# Patient Record
Sex: Female | Born: 1975 | Race: White | Hispanic: No | Marital: Married | Smoking: Never smoker
Health system: Southern US, Community
[De-identification: ages and names within clinical notes are randomized; demographics above are authoritative.]

## PROBLEM LIST (undated history)

## (undated) DIAGNOSIS — T8859XA Other complications of anesthesia, initial encounter: Secondary | ICD-10-CM

## (undated) DIAGNOSIS — Z789 Other specified health status: Secondary | ICD-10-CM

## (undated) DIAGNOSIS — F419 Anxiety disorder, unspecified: Secondary | ICD-10-CM

## (undated) DIAGNOSIS — T4145XA Adverse effect of unspecified anesthetic, initial encounter: Secondary | ICD-10-CM

## (undated) HISTORY — DX: Anxiety disorder, unspecified: F41.9

## (undated) HISTORY — PX: WISDOM TOOTH EXTRACTION: SHX21

---

## 1998-11-20 ENCOUNTER — Emergency Department (HOSPITAL_COMMUNITY): Admission: EM | Admit: 1998-11-20 | Discharge: 1998-11-20 | Payer: Self-pay | Admitting: Emergency Medicine

## 2000-12-18 ENCOUNTER — Other Ambulatory Visit: Admission: RE | Admit: 2000-12-18 | Discharge: 2000-12-18 | Payer: Self-pay | Admitting: *Deleted

## 2002-01-07 ENCOUNTER — Encounter: Payer: Self-pay | Admitting: Gastroenterology

## 2002-01-07 ENCOUNTER — Ambulatory Visit (HOSPITAL_COMMUNITY): Admission: RE | Admit: 2002-01-07 | Discharge: 2002-01-07 | Payer: Self-pay | Admitting: Gastroenterology

## 2002-01-21 ENCOUNTER — Other Ambulatory Visit: Admission: RE | Admit: 2002-01-21 | Discharge: 2002-01-21 | Payer: Self-pay | Admitting: Obstetrics & Gynecology

## 2002-05-01 ENCOUNTER — Other Ambulatory Visit: Admission: RE | Admit: 2002-05-01 | Discharge: 2002-05-01 | Payer: Self-pay | Admitting: Obstetrics & Gynecology

## 2002-12-09 ENCOUNTER — Other Ambulatory Visit: Admission: RE | Admit: 2002-12-09 | Discharge: 2002-12-09 | Payer: Self-pay | Admitting: Obstetrics & Gynecology

## 2003-05-25 ENCOUNTER — Other Ambulatory Visit: Admission: RE | Admit: 2003-05-25 | Discharge: 2003-05-25 | Payer: Self-pay | Admitting: Obstetrics & Gynecology

## 2006-01-22 ENCOUNTER — Inpatient Hospital Stay (HOSPITAL_COMMUNITY): Admission: AD | Admit: 2006-01-22 | Discharge: 2006-01-24 | Payer: Self-pay | Admitting: Obstetrics & Gynecology

## 2009-01-05 ENCOUNTER — Ambulatory Visit (HOSPITAL_COMMUNITY): Admission: AD | Admit: 2009-01-05 | Discharge: 2009-01-05 | Payer: Self-pay | Admitting: Obstetrics & Gynecology

## 2009-01-19 ENCOUNTER — Ambulatory Visit (HOSPITAL_COMMUNITY): Admission: RE | Admit: 2009-01-19 | Discharge: 2009-01-19 | Payer: Self-pay | Admitting: Obstetrics & Gynecology

## 2009-01-25 ENCOUNTER — Ambulatory Visit (HOSPITAL_COMMUNITY): Admission: RE | Admit: 2009-01-25 | Discharge: 2009-01-25 | Payer: Self-pay | Admitting: Obstetrics & Gynecology

## 2009-03-01 ENCOUNTER — Ambulatory Visit (HOSPITAL_COMMUNITY): Admission: RE | Admit: 2009-03-01 | Discharge: 2009-03-01 | Payer: Self-pay | Admitting: Obstetrics & Gynecology

## 2009-04-27 ENCOUNTER — Ambulatory Visit (HOSPITAL_COMMUNITY): Admission: RE | Admit: 2009-04-27 | Discharge: 2009-04-27 | Payer: Self-pay | Admitting: Obstetrics & Gynecology

## 2009-05-24 ENCOUNTER — Ambulatory Visit (HOSPITAL_COMMUNITY): Admission: RE | Admit: 2009-05-24 | Discharge: 2009-05-24 | Payer: Self-pay | Admitting: Obstetrics & Gynecology

## 2009-06-17 ENCOUNTER — Ambulatory Visit (HOSPITAL_COMMUNITY): Admission: RE | Admit: 2009-06-17 | Discharge: 2009-06-17 | Payer: Self-pay | Admitting: Obstetrics & Gynecology

## 2009-07-08 ENCOUNTER — Ambulatory Visit (HOSPITAL_COMMUNITY): Admission: RE | Admit: 2009-07-08 | Discharge: 2009-07-08 | Payer: Self-pay | Admitting: Obstetrics & Gynecology

## 2009-07-16 ENCOUNTER — Inpatient Hospital Stay (HOSPITAL_COMMUNITY): Admission: AD | Admit: 2009-07-16 | Discharge: 2009-07-20 | Payer: Self-pay | Admitting: Obstetrics & Gynecology

## 2009-07-17 ENCOUNTER — Encounter (INDEPENDENT_AMBULATORY_CARE_PROVIDER_SITE_OTHER): Payer: Self-pay | Admitting: Obstetrics & Gynecology

## 2010-04-16 IMAGING — US US OB FOLLOW-UP
1 series · 14 of 22 positions shown · non-contrast
Comparison: none

OBSTETRICAL ULTRASOUND:
 This ultrasound was performed in The [HOSPITAL], and the [facility] report will be stored to [REDACTED] PACS.  This report is also available in [HOSPITAL]?s accessANYware.

[Series 1: us ob follow-up · 14 of 22 slices shown]
[im 1/22]
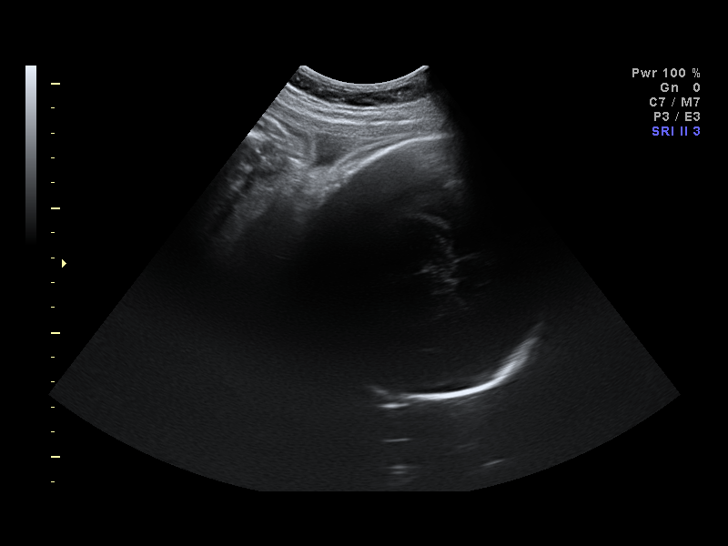
[im 3/22]
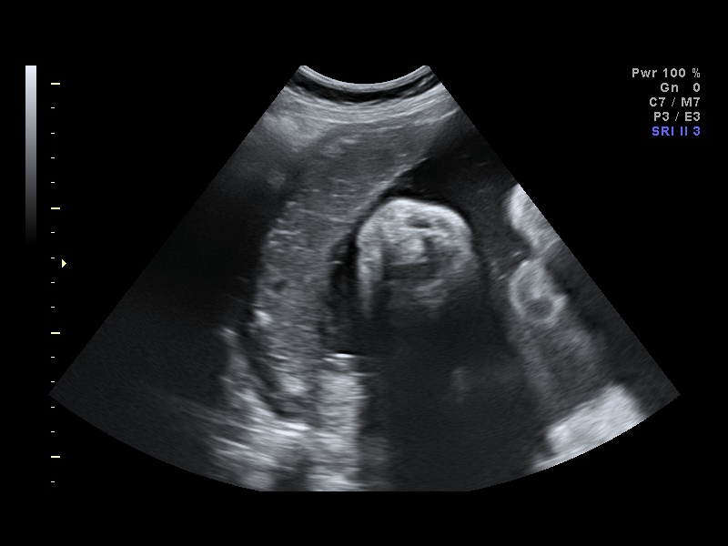
[im 4/22]
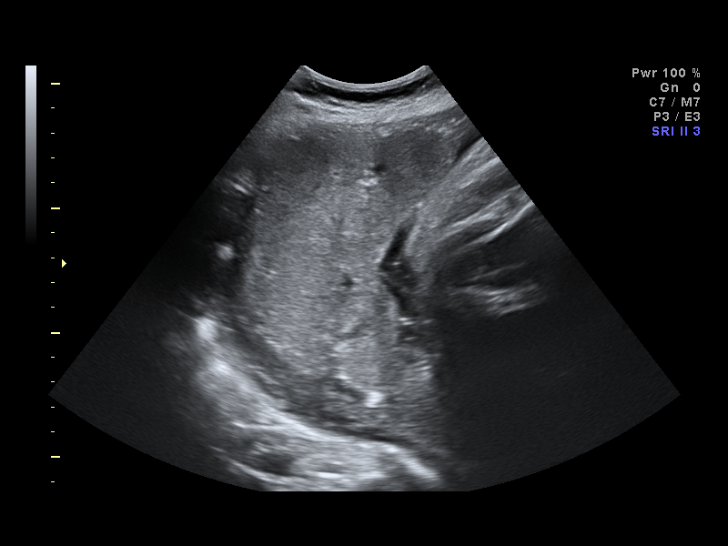
[im 6/22]
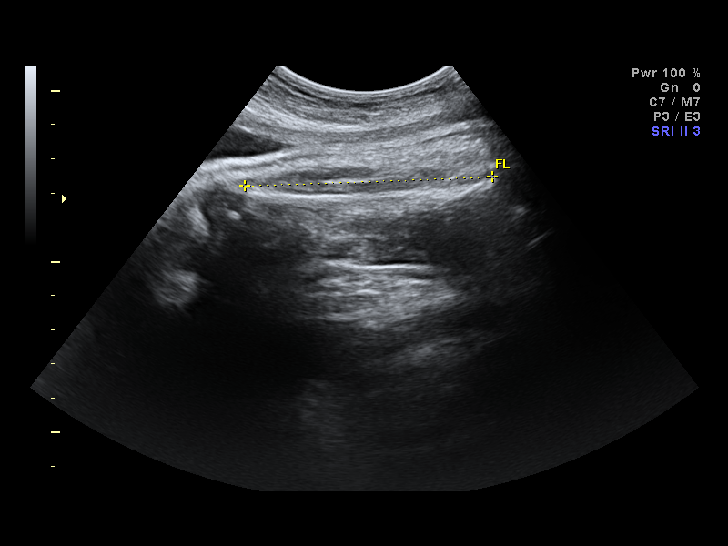
[im 8/22]
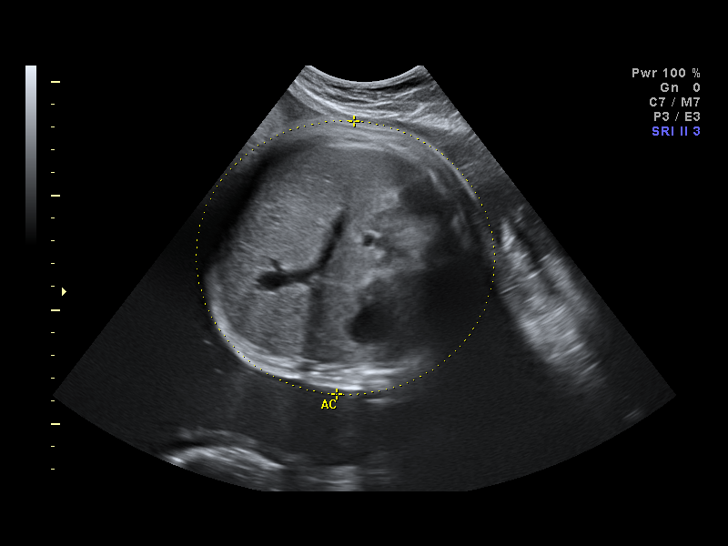
[im 9/22]
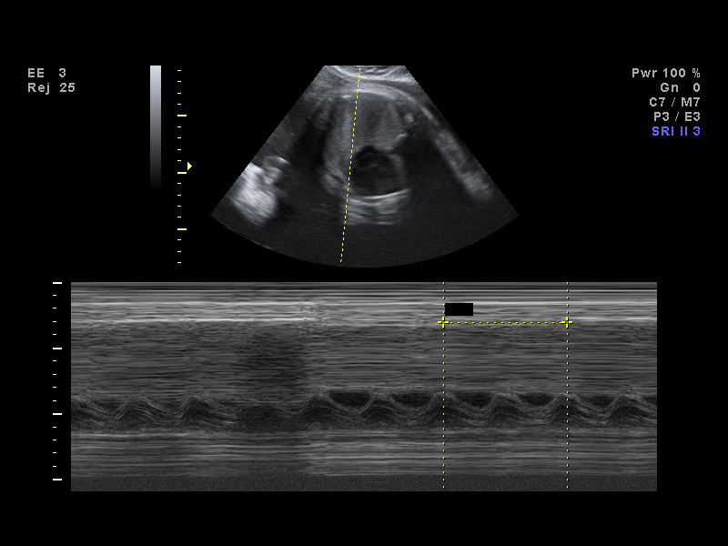
[im 11/22]
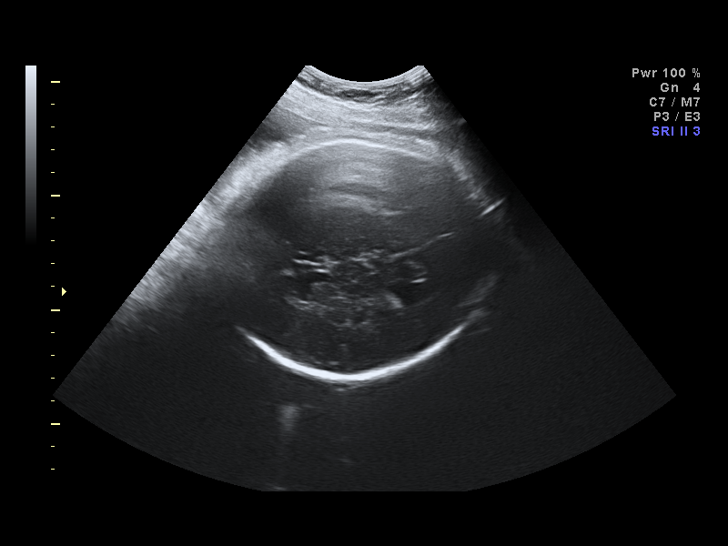
[im 12/22]
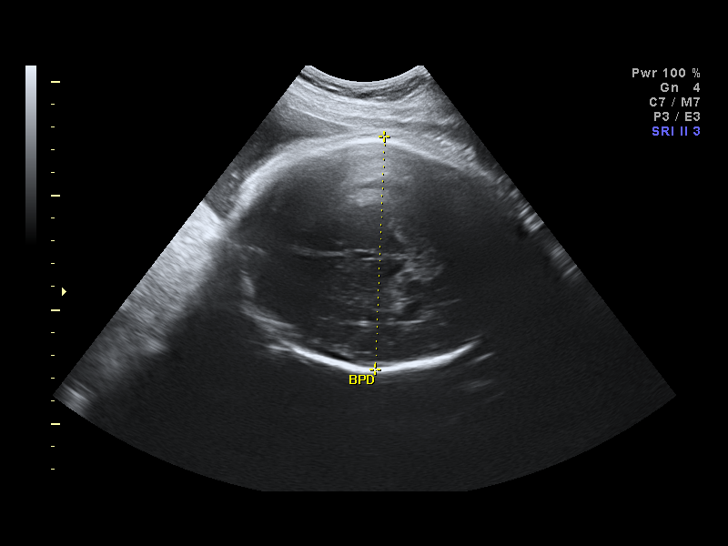
[im 14/22]
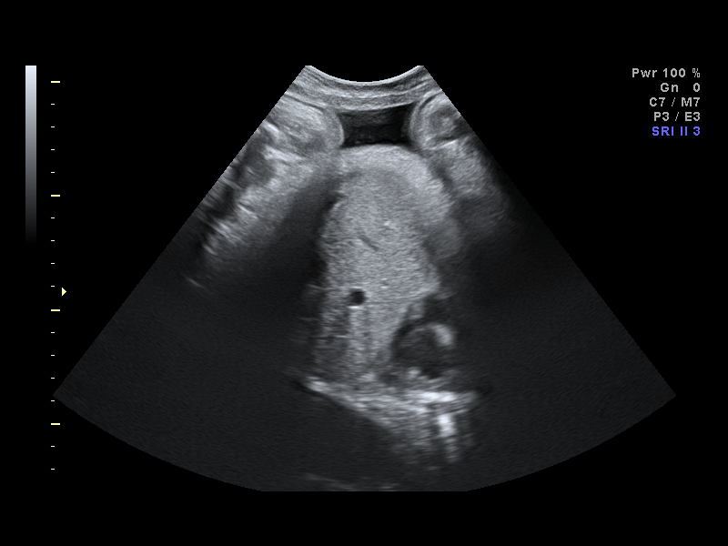
[im 15/22]
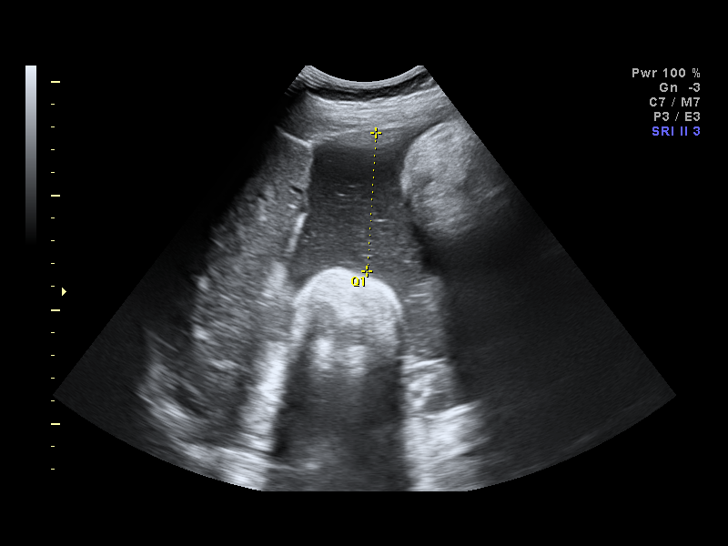
[im 17/22]
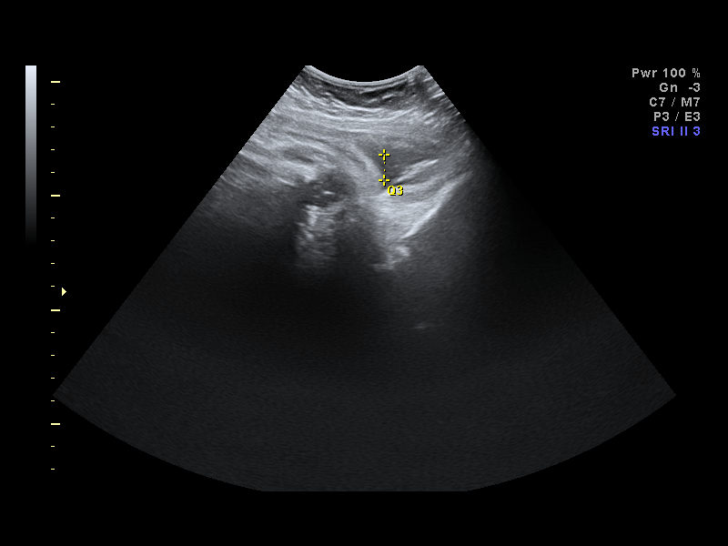
[im 19/22]
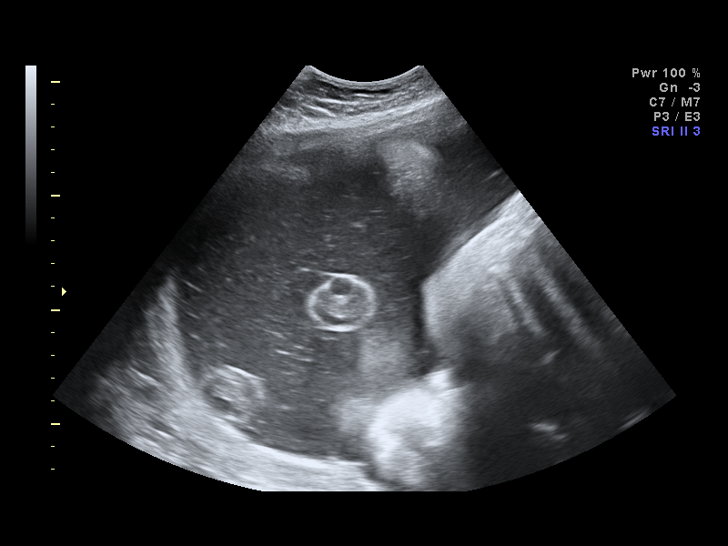
[im 20/22]
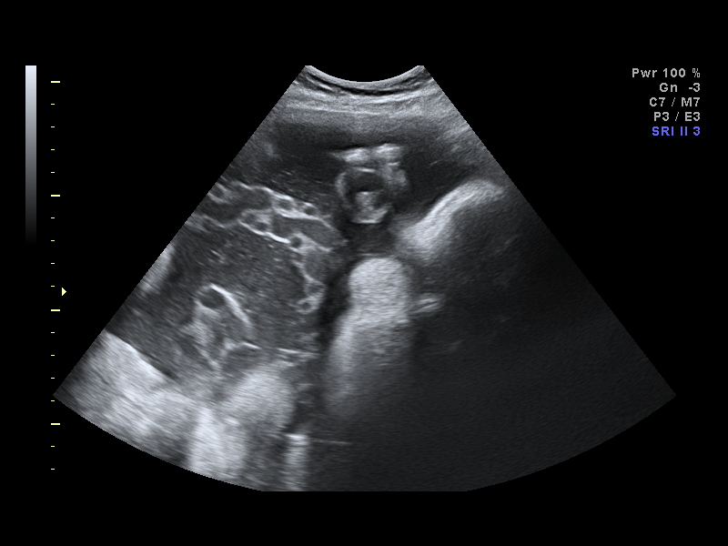
[im 22/22]
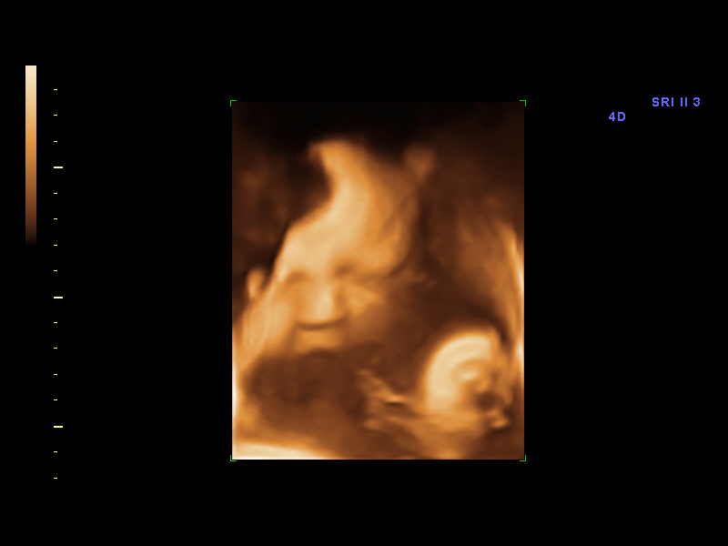

[14 of 22 positions shown; findings below may reference images not displayed]

IMPRESSION: [facility] has also been faxed to the ordering physician.

## 2010-07-23 LAB — CBC
Hemoglobin: 12.1 g/dL (ref 12.0–15.0)
MCHC: 33.5 g/dL (ref 30.0–36.0)
MCV: 94.8 fL (ref 78.0–100.0)
Platelets: 242 10*3/uL (ref 150–400)
RDW: 13.6 % (ref 11.5–15.5)
WBC: 17 10*3/uL — ABNORMAL HIGH (ref 4.0–10.5)

## 2010-07-23 LAB — RPR: RPR Ser Ql: NONREACTIVE

## 2012-01-31 ENCOUNTER — Encounter: Payer: Self-pay | Admitting: Cardiovascular Disease

## 2012-03-05 ENCOUNTER — Encounter: Payer: Self-pay | Admitting: Internal Medicine

## 2012-08-25 ENCOUNTER — Encounter: Payer: Self-pay | Admitting: Internal Medicine

## 2012-11-12 LAB — OB RESULTS CONSOLE HEPATITIS B SURFACE ANTIGEN: HEP B S AG: NEGATIVE

## 2012-11-12 LAB — OB RESULTS CONSOLE RPR: RPR: NONREACTIVE

## 2012-11-12 LAB — OB RESULTS CONSOLE HIV ANTIBODY (ROUTINE TESTING): HIV: NONREACTIVE

## 2012-11-12 LAB — OB RESULTS CONSOLE ABO/RH: RH TYPE: POSITIVE

## 2012-11-12 LAB — OB RESULTS CONSOLE ANTIBODY SCREEN: Antibody Screen: NEGATIVE

## 2012-11-12 LAB — OB RESULTS CONSOLE RUBELLA ANTIBODY, IGM: Rubella: IMMUNE

## 2013-06-03 LAB — OB RESULTS CONSOLE GBS: GBS: NEGATIVE

## 2013-06-25 ENCOUNTER — Inpatient Hospital Stay (HOSPITAL_COMMUNITY): Payer: BC Managed Care – PPO | Admitting: Anesthesiology

## 2013-06-25 ENCOUNTER — Inpatient Hospital Stay (HOSPITAL_COMMUNITY)
Admission: AD | Admit: 2013-06-25 | Discharge: 2013-06-27 | DRG: 765 | Disposition: A | Discharge status: Home / Self Care | Payer: BC Managed Care – PPO | Source: Ambulatory Visit | Attending: Obstetrics & Gynecology | Admitting: Obstetrics & Gynecology

## 2013-06-25 ENCOUNTER — Encounter (HOSPITAL_COMMUNITY)
Admission: AD | Disposition: A | Discharge status: Home / Self Care | Payer: Self-pay | Source: Ambulatory Visit | Attending: Obstetrics & Gynecology

## 2013-06-25 ENCOUNTER — Encounter (HOSPITAL_COMMUNITY): Payer: BC Managed Care – PPO | Admitting: Anesthesiology

## 2013-06-25 ENCOUNTER — Encounter (HOSPITAL_COMMUNITY): Payer: Self-pay | Admitting: *Deleted

## 2013-06-25 DIAGNOSIS — O34219 Maternal care for unspecified type scar from previous cesarean delivery: Secondary | ICD-10-CM | POA: Diagnosis present

## 2013-06-25 DIAGNOSIS — O9903 Anemia complicating the puerperium: Secondary | ICD-10-CM | POA: Diagnosis not present

## 2013-06-25 DIAGNOSIS — O09529 Supervision of elderly multigravida, unspecified trimester: Secondary | ICD-10-CM | POA: Diagnosis present

## 2013-06-25 DIAGNOSIS — O3660X0 Maternal care for excessive fetal growth, unspecified trimester, not applicable or unspecified: Principal | ICD-10-CM | POA: Diagnosis present

## 2013-06-25 DIAGNOSIS — D62 Acute posthemorrhagic anemia: Secondary | ICD-10-CM | POA: Diagnosis not present

## 2013-06-25 HISTORY — DX: Other specified health status: Z78.9

## 2013-06-25 HISTORY — DX: Adverse effect of unspecified anesthetic, initial encounter: T41.45XA

## 2013-06-25 HISTORY — DX: Other complications of anesthesia, initial encounter: T88.59XA

## 2013-06-25 LAB — CBC
HCT: 34.7 % — ABNORMAL LOW (ref 36.0–46.0)
HCT: 34.5 % — ABNORMAL LOW (ref 36.0–46.0)
HEMOGLOBIN: 11.8 g/dL — AB (ref 12.0–15.0)
Hemoglobin: 11.8 g/dL — ABNORMAL LOW (ref 12.0–15.0)
MCH: 30.6 pg (ref 26.0–34.0)
MCH: 31.1 pg (ref 26.0–34.0)
MCHC: 34 g/dL (ref 30.0–36.0)
MCHC: 34.2 g/dL (ref 30.0–36.0)
MCV: 89.9 fL (ref 78.0–100.0)
MCV: 90.8 fL (ref 78.0–100.0)
PLATELETS: 193 10*3/uL (ref 150–400)
Platelets: 167 10*3/uL (ref 150–400)
RBC: 3.86 MIL/uL — AB (ref 3.87–5.11)
RBC: 3.8 MIL/uL — ABNORMAL LOW (ref 3.87–5.11)
RDW: 13.8 % (ref 11.5–15.5)
RDW: 13.8 % (ref 11.5–15.5)
WBC: 12.4 10*3/uL — ABNORMAL HIGH (ref 4.0–10.5)
WBC: 12.7 10*3/uL — AB (ref 4.0–10.5)

## 2013-06-25 LAB — TYPE AND SCREEN
ABO/RH(D): A POS
Antibody Screen: NEGATIVE

## 2013-06-25 LAB — ABO/RH: ABO/RH(D): A POS

## 2013-06-25 LAB — RPR: RPR: NONREACTIVE

## 2013-06-25 SURGERY — Surgical Case
Anesthesia: Spinal | Site: Abdomen

## 2013-06-25 MED ORDER — PHENYLEPHRINE 8 MG IN D5W 100 ML (0.08MG/ML) PREMIX OPTIME
INJECTION | INTRAVENOUS | Status: DC | PRN
Start: 2013-06-25 — End: 2013-06-25
  Administered 2013-06-25: 45 ug/min via INTRAVENOUS

## 2013-06-25 MED ORDER — CEFAZOLIN SODIUM-DEXTROSE 2-3 GM-% IV SOLR: 2.0000 g | INTRAVENOUS | Status: DC

## 2013-06-25 MED ORDER — CEFAZOLIN SODIUM-DEXTROSE 2-3 GM-% IV SOLR
2.0000 g | INTRAVENOUS | Status: AC
  Administered 2013-06-25: 2 g via INTRAVENOUS

## 2013-06-25 MED ORDER — OXYCODONE-ACETAMINOPHEN 5-325 MG PO TABS
1.0000 | ORAL_TABLET | ORAL | Status: DC | PRN
  Administered 2013-06-26 (×2): 2 via ORAL
  Administered 2013-06-27: 1 via ORAL
  Filled 2013-06-25: qty 1
  Filled 2013-06-25 (×2): qty 2

## 2013-06-25 MED ORDER — OXYTOCIN 40 UNITS IN LACTATED RINGERS INFUSION - SIMPLE MED: 62.5000 mL/h | INTRAVENOUS | Status: AC

## 2013-06-25 MED ORDER — ATROPINE SULFATE 0.4 MG/ML IJ SOLN
INTRAMUSCULAR | Status: DC | PRN
  Administered 2013-06-25: .1 mg via INTRAVENOUS
  Administered 2013-06-25: 0.2 mg via INTRAVENOUS

## 2013-06-25 MED ORDER — WITCH HAZEL-GLYCERIN EX PADS: 1.0000 "application " | MEDICATED_PAD | CUTANEOUS | Status: DC | PRN

## 2013-06-25 MED ORDER — EPHEDRINE SULFATE 50 MG/ML IJ SOLN
INTRAMUSCULAR | Status: DC | PRN
  Administered 2013-06-25: 10 mg via INTRAVENOUS
  Administered 2013-06-25: 15 mg via INTRAVENOUS

## 2013-06-25 MED ORDER — KETOROLAC TROMETHAMINE 30 MG/ML IJ SOLN
INTRAMUSCULAR | Status: AC
  Administered 2013-06-25: 30 mg via INTRAMUSCULAR
  Filled 2013-06-25: qty 1

## 2013-06-25 MED ORDER — LORATADINE 10 MG PO TABS
10.0000 mg | ORAL_TABLET | Freq: Every day | ORAL | Status: DC
  Administered 2013-06-26 – 2013-06-27 (×2): 10 mg via ORAL
  Filled 2013-06-25 (×4): qty 1

## 2013-06-25 MED ORDER — METHYLERGONOVINE MALEATE 0.2 MG/ML IJ SOLN
INTRAMUSCULAR | Status: AC
  Filled 2013-06-25: qty 1

## 2013-06-25 MED ORDER — LANOLIN HYDROUS EX OINT: 1.0000 "application " | TOPICAL_OINTMENT | CUTANEOUS | Status: DC | PRN

## 2013-06-25 MED ORDER — OXYTOCIN 10 UNIT/ML IJ SOLN
INTRAMUSCULAR | Status: AC
  Filled 2013-06-25: qty 1

## 2013-06-25 MED ORDER — METHYLERGONOVINE MALEATE 0.2 MG/ML IJ SOLN
INTRAMUSCULAR | Status: DC | PRN
  Administered 2013-06-25: 0.2 mg via INTRAMUSCULAR

## 2013-06-25 MED ORDER — CEFAZOLIN SODIUM-DEXTROSE 2-3 GM-% IV SOLR
INTRAVENOUS | Status: AC
  Filled 2013-06-25: qty 50

## 2013-06-25 MED ORDER — FENTANYL CITRATE 0.05 MG/ML IJ SOLN
INTRAMUSCULAR | Status: DC | PRN
  Administered 2013-06-25: 25 ug via INTRATHECAL

## 2013-06-25 MED ORDER — PHENYLEPHRINE 8 MG IN D5W 100 ML (0.08MG/ML) PREMIX OPTIME
INJECTION | INTRAVENOUS | Status: AC
  Filled 2013-06-25: qty 100

## 2013-06-25 MED ORDER — ONDANSETRON HCL 4 MG/2ML IJ SOLN
INTRAMUSCULAR | Status: DC | PRN
  Administered 2013-06-25: 4 mg via INTRAVENOUS

## 2013-06-25 MED ORDER — DIPHENHYDRAMINE HCL 25 MG PO CAPS: 25.0000 mg | ORAL_CAPSULE | Freq: Four times a day (QID) | ORAL | Status: DC | PRN

## 2013-06-25 MED ORDER — SENNOSIDES-DOCUSATE SODIUM 8.6-50 MG PO TABS
2.0000 | ORAL_TABLET | ORAL | Status: DC
  Administered 2013-06-25 – 2013-06-27 (×2): 2 via ORAL
  Filled 2013-06-25 (×2): qty 2

## 2013-06-25 MED ORDER — NALBUPHINE HCL 10 MG/ML IJ SOLN: 5.0000 mg | INTRAMUSCULAR | Status: DC | PRN

## 2013-06-25 MED ORDER — LACTATED RINGERS IV BOLUS (SEPSIS)
1000.0000 mL | Freq: Once | INTRAVENOUS | Status: AC
  Administered 2013-06-25: 1000 mL via INTRAVENOUS

## 2013-06-25 MED ORDER — PRENATAL MULTIVITAMIN CH
1.0000 | ORAL_TABLET | Freq: Every day | ORAL | Status: DC
  Administered 2013-06-26 – 2013-06-27 (×2): 1 via ORAL
  Filled 2013-06-25 (×2): qty 1

## 2013-06-25 MED ORDER — KETOROLAC TROMETHAMINE 30 MG/ML IJ SOLN: 30.0000 mg | Freq: Four times a day (QID) | INTRAMUSCULAR | Status: AC | PRN

## 2013-06-25 MED ORDER — SCOPOLAMINE 1 MG/3DAYS TD PT72
1.0000 | MEDICATED_PATCH | Freq: Once | TRANSDERMAL | Status: DC
  Administered 2013-06-25: 1.5 mg via TRANSDERMAL

## 2013-06-25 MED ORDER — OXYTOCIN 10 UNIT/ML IJ SOLN
40.0000 [IU] | INTRAVENOUS | Status: DC | PRN
  Administered 2013-06-25: 40 [IU] via INTRAVENOUS

## 2013-06-25 MED ORDER — DIBUCAINE 1 % RE OINT: 1.0000 "application " | TOPICAL_OINTMENT | RECTAL | Status: DC | PRN

## 2013-06-25 MED ORDER — DIPHENHYDRAMINE HCL 25 MG PO CAPS
25.0000 mg | ORAL_CAPSULE | ORAL | Status: DC | PRN
  Filled 2013-06-25: qty 1

## 2013-06-25 MED ORDER — LACTATED RINGERS IV SOLN
INTRAVENOUS | Status: DC
  Administered 2013-06-25: 17:00:00 via INTRAVENOUS

## 2013-06-25 MED ORDER — KETOROLAC TROMETHAMINE 30 MG/ML IJ SOLN
30.0000 mg | Freq: Four times a day (QID) | INTRAMUSCULAR | Status: AC | PRN
  Administered 2013-06-25: 30 mg via INTRAMUSCULAR

## 2013-06-25 MED ORDER — ONDANSETRON HCL 4 MG/2ML IJ SOLN: 4.0000 mg | Freq: Three times a day (TID) | INTRAMUSCULAR | Status: DC | PRN

## 2013-06-25 MED ORDER — DIPHENHYDRAMINE HCL 50 MG/ML IJ SOLN: 12.5000 mg | INTRAMUSCULAR | Status: DC | PRN

## 2013-06-25 MED ORDER — ONDANSETRON HCL 4 MG/2ML IJ SOLN
INTRAMUSCULAR | Status: AC
  Filled 2013-06-25: qty 2

## 2013-06-25 MED ORDER — SCOPOLAMINE 1 MG/3DAYS TD PT72
MEDICATED_PATCH | TRANSDERMAL | Status: AC
  Administered 2013-06-25: 1.5 mg via TRANSDERMAL
  Filled 2013-06-25: qty 1

## 2013-06-25 MED ORDER — DIPHENHYDRAMINE HCL 50 MG/ML IJ SOLN: 25.0000 mg | INTRAMUSCULAR | Status: DC | PRN

## 2013-06-25 MED ORDER — FENTANYL CITRATE 0.05 MG/ML IJ SOLN
INTRAMUSCULAR | Status: AC
  Filled 2013-06-25: qty 2

## 2013-06-25 MED ORDER — MORPHINE SULFATE 0.5 MG/ML IJ SOLN
INTRAMUSCULAR | Status: AC
  Filled 2013-06-25: qty 10

## 2013-06-25 MED ORDER — METHYLPREDNISOLONE SODIUM SUCC 125 MG IJ SOLR
INTRAMUSCULAR | Status: AC
  Filled 2013-06-25: qty 2

## 2013-06-25 MED ORDER — LACTATED RINGERS IV SOLN
INTRAVENOUS | Status: DC | PRN
  Administered 2013-06-25: 09:00:00 via INTRAVENOUS

## 2013-06-25 MED ORDER — MEPERIDINE HCL 25 MG/ML IJ SOLN: 6.2500 mg | INTRAMUSCULAR | Status: DC | PRN

## 2013-06-25 MED ORDER — NALOXONE HCL 0.4 MG/ML IJ SOLN: 0.4000 mg | INTRAMUSCULAR | Status: DC | PRN

## 2013-06-25 MED ORDER — FAMOTIDINE IN NACL 20-0.9 MG/50ML-% IV SOLN
20.0000 mg | Freq: Once | INTRAVENOUS | Status: AC
  Administered 2013-06-25: 20 mg via INTRAVENOUS
  Filled 2013-06-25: qty 50

## 2013-06-25 MED ORDER — LACTATED RINGERS IV SOLN
INTRAVENOUS | Status: DC
  Administered 2013-06-25 (×3): via INTRAVENOUS

## 2013-06-25 MED ORDER — IBUPROFEN 600 MG PO TABS
600.0000 mg | ORAL_TABLET | Freq: Four times a day (QID) | ORAL | Status: DC
  Administered 2013-06-25 – 2013-06-27 (×8): 600 mg via ORAL
  Filled 2013-06-25 (×9): qty 1

## 2013-06-25 MED ORDER — ONDANSETRON HCL 4 MG/2ML IJ SOLN: 4.0000 mg | INTRAMUSCULAR | Status: DC | PRN

## 2013-06-25 MED ORDER — SODIUM CHLORIDE 0.9 % IJ SOLN: 3.0000 mL | INTRAMUSCULAR | Status: DC | PRN

## 2013-06-25 MED ORDER — CITRIC ACID-SODIUM CITRATE 334-500 MG/5ML PO SOLN
30.0000 mL | Freq: Once | ORAL | Status: AC
  Administered 2013-06-25: 30 mL via ORAL
  Filled 2013-06-25: qty 15

## 2013-06-25 MED ORDER — NALOXONE HCL 1 MG/ML IJ SOLN
1.0000 ug/kg/h | INTRAVENOUS | Status: DC | PRN
  Filled 2013-06-25: qty 2

## 2013-06-25 MED ORDER — SIMETHICONE 80 MG PO CHEW
80.0000 mg | CHEWABLE_TABLET | ORAL | Status: DC
  Administered 2013-06-25 – 2013-06-27 (×2): 80 mg via ORAL
  Filled 2013-06-25 (×2): qty 1

## 2013-06-25 MED ORDER — MENTHOL 3 MG MT LOZG
1.0000 | LOZENGE | OROMUCOSAL | Status: DC | PRN
  Filled 2013-06-25: qty 9

## 2013-06-25 MED ORDER — ZOLPIDEM TARTRATE 5 MG PO TABS: 5.0000 mg | ORAL_TABLET | Freq: Every evening | ORAL | Status: DC | PRN

## 2013-06-25 MED ORDER — OXYTOCIN 10 UNIT/ML IJ SOLN
INTRAMUSCULAR | Status: AC
  Filled 2013-06-25: qty 3

## 2013-06-25 MED ORDER — SIMETHICONE 80 MG PO CHEW: 80.0000 mg | CHEWABLE_TABLET | ORAL | Status: DC | PRN

## 2013-06-25 MED ORDER — MORPHINE SULFATE (PF) 0.5 MG/ML IJ SOLN
INTRAMUSCULAR | Status: DC | PRN
  Administered 2013-06-25: .15 mg via EPIDURAL

## 2013-06-25 MED ORDER — SIMETHICONE 80 MG PO CHEW
80.0000 mg | CHEWABLE_TABLET | Freq: Three times a day (TID) | ORAL | Status: DC
Start: 2013-06-25 — End: 2013-06-27
  Administered 2013-06-25 – 2013-06-27 (×5): 80 mg via ORAL
  Filled 2013-06-25 (×6): qty 1

## 2013-06-25 MED ORDER — EPHEDRINE 5 MG/ML INJ
INTRAVENOUS | Status: AC
  Filled 2013-06-25: qty 20

## 2013-06-25 MED ORDER — ONDANSETRON HCL 4 MG PO TABS: 4.0000 mg | ORAL_TABLET | ORAL | Status: DC | PRN

## 2013-06-25 MED ORDER — HYDROMORPHONE HCL PF 1 MG/ML IJ SOLN: 0.2500 mg | INTRAMUSCULAR | Status: DC | PRN

## 2013-06-25 MED ORDER — TETANUS-DIPHTH-ACELL PERTUSSIS 5-2.5-18.5 LF-MCG/0.5 IM SUSP: 0.5000 mL | Freq: Once | INTRAMUSCULAR | Status: DC

## 2013-06-25 MED ORDER — METOCLOPRAMIDE HCL 5 MG/ML IJ SOLN: 10.0000 mg | Freq: Three times a day (TID) | INTRAMUSCULAR | Status: DC | PRN

## 2013-06-25 MED ORDER — ATROPINE SULFATE 0.4 MG/ML IJ SOLN
INTRAMUSCULAR | Status: AC
  Filled 2013-06-25: qty 1

## 2013-06-25 SURGICAL SUPPLY — 41 items
APL SKNCLS STERI-STRIP NONHPOA (GAUZE/BANDAGES/DRESSINGS) ×1
BENZOIN TINCTURE PRP APPL 2/3 (GAUZE/BANDAGES/DRESSINGS) ×2 IMPLANT
CLAMP CORD UMBIL (MISCELLANEOUS) IMPLANT
CLOSURE WOUND 1/2 X4 (GAUZE/BANDAGES/DRESSINGS)
CLOTH BEACON ORANGE TIMEOUT ST (SAFETY) ×3 IMPLANT
CONTAINER PREFILL 10% NBF 15ML (MISCELLANEOUS) IMPLANT
DRAPE LG THREE QUARTER DISP (DRAPES) IMPLANT
DRSG OPSITE POSTOP 4X10 (GAUZE/BANDAGES/DRESSINGS) ×3 IMPLANT
DURAPREP 26ML APPLICATOR (WOUND CARE) ×3 IMPLANT
ELECT REM PT RETURN 9FT ADLT (ELECTROSURGICAL) ×3
ELECTRODE REM PT RTRN 9FT ADLT (ELECTROSURGICAL) ×1 IMPLANT
EXTRACTOR VACUUM KIWI (MISCELLANEOUS) IMPLANT
EXTRACTOR VACUUM M CUP 4 TUBE (SUCTIONS) ×1 IMPLANT
EXTRACTOR VACUUM M CUP 4' TUBE (SUCTIONS) ×1
GLOVE BIO SURGEON STRL SZ7 (GLOVE) ×3 IMPLANT
GLOVE BIOGEL PI IND STRL 7.0 (GLOVE) ×1 IMPLANT
GLOVE BIOGEL PI INDICATOR 7.0 (GLOVE) ×2
GOWN STRL REUS W/TWL LRG LVL3 (GOWN DISPOSABLE) ×6 IMPLANT
KIT ABG SYR 3ML LUER SLIP (SYRINGE) IMPLANT
NDL HYPO 25X5/8 SAFETYGLIDE (NEEDLE) IMPLANT
NEEDLE HYPO 25X5/8 SAFETYGLIDE (NEEDLE) IMPLANT
NS IRRIG 1000ML POUR BTL (IV SOLUTION) ×3 IMPLANT
PACK C SECTION WH (CUSTOM PROCEDURE TRAY) ×3 IMPLANT
PAD ABD 7.5X8 STRL (GAUZE/BANDAGES/DRESSINGS) ×2 IMPLANT
PAD OB MATERNITY 4.3X12.25 (PERSONAL CARE ITEMS) ×3 IMPLANT
RTRCTR C-SECT PINK 25CM LRG (MISCELLANEOUS) IMPLANT
STAPLER VISISTAT 35W (STAPLE) IMPLANT
STRIP CLOSURE SKIN 1/2X4 (GAUZE/BANDAGES/DRESSINGS) IMPLANT
SUT MON AB-0 CT1 36 (SUTURE) ×9 IMPLANT
SUT PLAIN 0 NONE (SUTURE) IMPLANT
SUT PLAIN 2 0 (SUTURE)
SUT PLAIN ABS 2-0 CT1 27XMFL (SUTURE) IMPLANT
SUT VIC AB 0 CT1 27 (SUTURE) ×6
SUT VIC AB 0 CT1 27XBRD ANBCTR (SUTURE) ×2 IMPLANT
SUT VIC AB 2-0 CT1 27 (SUTURE) ×6
SUT VIC AB 2-0 CT1 TAPERPNT 27 (SUTURE) ×2 IMPLANT
SUT VIC AB 4-0 KS 27 (SUTURE) IMPLANT
SUT VICRYL 0 TIES 12 18 (SUTURE) IMPLANT
TOWEL OR 17X24 6PK STRL BLUE (TOWEL DISPOSABLE) ×3 IMPLANT
TRAY FOLEY CATH 14FR (SET/KITS/TRAYS/PACK) IMPLANT
WATER STERILE IRR 1000ML POUR (IV SOLUTION) ×3 IMPLANT

## 2013-06-25 NOTE — MAU Note (Signed)
Nursery notified of posted c/s.

## 2013-06-25 NOTE — Anesthesia Postprocedure Evaluation (Signed)
  Anesthesia Post-op Note  Patient: Katherine Ellison  Procedure(s) Performed: Procedure(s): CESAREAN SECTION (N/A)  Patient is awake, responsive, moving her legs, and has signs of resolution of her numbness. Pain and nausea are reasonably well controlled. Vital signs are stable and clinically acceptable. Oxygen saturation is clinically acceptable. There are no apparent anesthetic complications at this time. Patient is ready for discharge.

## 2013-06-25 NOTE — Anesthesia Procedure Notes (Signed)

## 2013-06-25 NOTE — Addendum Note (Signed)
Addendum created 06/25/13 1707 by Algis GreenhouseLinda A Minta Fair, CRNA   Modules edited: Notes Section   Notes Section:  File: [id number]

## 2013-06-25 NOTE — Lactation Note (Signed)
This note was copied from the chart of Girl Purvis KiltsMelissa Rhyne. Lactation Consultation Note Initial consult:  Baby girl 3 hours old. Mother has baby in modified football position, baby opens wide and latches easily, sucks observed.  LS9. Reviewed feeding cues, basics, STS, lactation support services and brochure.  Encouraged mother to call for further assistance.   Patient Name: Girl Purvis KiltsMelissa Hammack RUEAV'WToday's Date: 06/25/2013 Reason for consult: Initial assessment   Maternal Data Infant to breast within first hour of birth: Yes Does the patient have breastfeeding experience prior to this delivery?: Yes  Feeding Feeding Type: Breast Fed Length of feed: 60 min  LATCH Score/Interventions Latch: Grasps breast easily, tongue down, lips flanged, rhythmical sucking.  Audible Swallowing: Spontaneous and intermittent Intervention(s): Skin to skin  Type of Nipple: Everted at rest and after stimulation  Comfort (Breast/Nipple): Soft / non-tender     Hold (Positioning): Assistance needed to correctly position infant at breast and maintain latch.  LATCH Score: 9  Lactation Tools Discussed/Used     Consult Status Consult Status: Follow-up Date: 06/26/13 Follow-up type: In-patient    Dahlia ByesBerkelhammer, Jahmani Staup [facility] 06/25/2013, 12:05 PM

## 2013-06-25 NOTE — MAU Note (Signed)
Contractions x 3 hours, q 5 minutes.

## 2013-06-25 NOTE — Op Note (Addendum)
Cesarean Section Procedure Note Katherine SjogrenMelissa H Kluesner 06/25/2013  Indications: Scheduled Proceedure/Maternal Request due to prior C-section and suspected macrosomia, EFW 4600 gm  Pre-operative Diagnosis: repeat c section for labor at 40.5 wks  Post-operative Diagnosis: Same   Surgeon:  Robley FriesVaishali R Kristene Liberati, MD   Assistants: Raelyn Moraolitta Dawson, CNM  Anesthesia: spinal   Procedure Details:  The patient was seen in the Holding Room. The risks, benefits, complications, treatment options, and expected outcomes were discussed with the patient. The patient concurred with the proposed plan, giving informed consent. identified as Katherine Ellison and the procedure verified as C-Section Delivery. A Time Out was held and the above information confirmed. 2 gm Ancef given.  After induction of spinal anesthesia, the patient was draped and prepped in the usual sterile manner. A Pfannenstiel incision was made and carried down through the subcutaneous tissue to the fascia. Fascial incision was made and extended transversely. The fascia was separated from the underlying rectus tissue superiorly and inferiorly. The peritoneum was identified and entered. Peritoneal incision was extended longitudinally. The utero-vesical peritoneal reflection was incised transversely and the bladder flap was bluntly freed from the lower uterine segment. A low transverse uterine incision was made. Delivered from cephalic presentation with one vacuum pull was a healthy vigorous baby Girl at 8.56 am on 06/25/13 with Apgar scores of 9 at one minute and 9 at five minutes. Cord ph was not sent the umbilical cord was clamped and cut cord blood was obtained for evaluation. The placenta was removed Intact and appeared normal but large. The uterine outline, tubes and ovaries appeared normal. The uterine incision was closed with running locked sutures of 0Monocryl in 2 layers. Methergine 0.2 mg IM given for uterine atony and uterus firmed up well. Hemostasis was  observed. Peritoneal closure done with 2-0 Vicryl. The fascia was then reapproximated with running sutures of 0Vicryl. The subcuticular closure was performed using 4-0Vicryl. Steristrips and sterile dressing applied.    Instrument, sponge, and needle counts were correct prior the abdominal closure and were correct at the conclusion of the case.   Findings: [facility] with one vacuum assist, delivered at 8.56 am, vigorous cry, APGARs 9 and 9. Normal uterus, tubes and ovaries.    Estimated Blood Loss: 800 ml   Total IV Fluids:  2600 ml   Urine Output: 700 CC OF clear urine  Specimens: cord blood  Complications: no complications  Disposition: PACU - hemodynamically stable.   Maternal Condition: stable   Baby condition / location:  Couplet care / Skin to Skin  Attending Attestation: I performed the procedure.   Signed: Surgeon(s): Robley FriesVaishali R Matias Thurman, MD

## 2013-06-25 NOTE — Anesthesia Preprocedure Evaluation (Addendum)
Anesthesia Evaluation  Patient identified by MRN, date of birth, ID band Patient awake    Reviewed: Allergy & Precautions, H&P , Patient's Chart, lab work & pertinent test results  History of Anesthesia Complications Negative for: history of anesthetic complications  Airway Mallampati: II TM Distance: >3 FB Neck ROM: full    Dental  (+) Teeth Intact   Pulmonary neg shortness of breath, neg sleep apnea, neg COPDRecent URI ,  breath sounds clear to auscultation        Cardiovascular negative cardio ROS  Rhythm:regular Rate:Normal     Neuro/Psych negative neurological ROS  negative psych ROS   GI/Hepatic Neg liver ROS, GERD-  ,  Endo/Other  negative endocrine ROS  Renal/GU negative Renal ROS  negative genitourinary   Musculoskeletal negative musculoskeletal ROS (+)   Abdominal   Peds  Hematology  (+) anemia ,   Anesthesia Other Findings       Reproductive/Obstetrics (+) Pregnancy                        Anesthesia Physical Anesthesia Plan  ASA: II  Anesthesia Plan: Spinal   Post-op Pain Management:    Induction:   Airway Management Planned: Natural Airway  Additional Equipment: None  Intra-op Plan:   Post-operative Plan:   Informed Consent: I have reviewed the patients History and Physical, chart, labs and discussed the procedure including the risks, benefits and alternatives for the proposed anesthesia with the patient or authorized representative who has indicated his/her understanding and acceptance.   Dental Advisory Given  Plan Discussed with: CRNA and Surgeon  Anesthesia Plan Comments: (Labs checked- platelets confirmed with RN in room. Fetal heart tracing, per RN, reported to be stable enough for sitting procedure. Discussed epidural, and patient consents to the procedure:  included risk of possible headache,backache, failed block, allergic reaction, and nerve injury.  This patient was asked if she had any questions or concerns before the procedure started.)       Anesthesia Quick Evaluation

## 2013-06-25 NOTE — Anesthesia Postprocedure Evaluation (Signed)
Anesthesia Post Note  Patient: Katherine SjogrenMelissa H Thull  Procedure(s) Performed: Procedure(s) (LRB): CESAREAN SECTION (N/A)  Anesthesia type: Spinal  Patient location: Mother/Baby  Post pain: Pain level controlled  Post assessment: Post-op Vital signs reviewed  Last Vitals:  Filed Vitals:   06/25/13 1430  BP: 103/68  Pulse: 72  Temp:   Resp: 20    Post vital signs: Reviewed  Level of consciousness: awake  Complications: No apparent anesthesia complications

## 2013-06-25 NOTE — Transfer of Care (Signed)
Immediate Anesthesia Transfer of Care Note  Patient: Katherine Ellison  Procedure(s) Performed: Procedure(s): CESAREAN SECTION (N/A)  Patient Location: PACU  Anesthesia Type:Spinal  Level of Consciousness: awake, alert , oriented and patient cooperative  Airway & Oxygen Therapy: Patient Spontanous Breathing  Post-op Assessment: Report given to PACU RN and Post -op Vital signs reviewed and stable  Post vital signs: Reviewed and stable  Complications: No apparent anesthesia complications

## 2013-06-25 NOTE — H&P (Signed)
Katherine Ellison is a 38 y.o. female presenting at 40/5 wks in active labor. She has one SVD, one C/s for suspected macrosomia and now baby EFW is 4600 gm and is desiring rpt C/s after reviewing risks/ complications. No leaking. Cervical change per MAU RN.  [facility]- Dr Seymour BarsLavoie. Uncomplicated pregnancy, all labs nl, genetic test declined  G1-term SVD 8'11" G2- term C/s (was twin with one demise with cystic hygromas) 9'6"   Maternal Medical History:  Reason for admission: Contractions.     OB History   Grav Para Term Preterm Abortions TAB SAB Ect Mult Living   3 2 2       2      Past Medical History  Diagnosis Date  . Medical history non-contributory   . Complication of anesthesia    Past Surgical History  Procedure Laterality Date  . Wisdom tooth extraction    . Cesarean section     Family History: family history includes Hyperlipidemia (age of onset: 5856) in her mother. Social History:  reports that she has never smoked. She does not have any smokeless tobacco history on file. She reports that she drinks alcohol. She reports that she does not use illicit drugs.   Prenatal Transfer Tool  Maternal Diabetes: No Genetic Screening: Declined Maternal Ultrasounds/Referrals: Normal Fetal Ultrasounds or other Referrals:  None Maternal Substance Abuse:  No Significant Maternal Medications:  None Significant Maternal Lab Results:  Lab values include: Group B Strep negative Other Comments:  None  ROS neg  Dilation: 4.5 Effacement (%): 80 Station: -2;-1 Exam by:: K.WIlson,RN Blood pressure 124/75, pulse 85, temperature 97.6 F (36.4 C), temperature source Oral, resp. rate 18, height 5\' 6"  (1.676 m), weight 193 lb (87.544 kg), SpO2 100.00%. Exam Physical Exam  A&O x 3, no acute distress. Pleasant HEENT neg, no thyromegaly Lungs CTA bilat CV RRR, S1S2 normal Abdo soft, non tender, non acute Extr no edema/ tenderness Pelvic above FHT  150s/ + accels/ no decels/ mod variab/  reassuring, category I Toco q 3 min  Prenatal labs: ABO, Rh: A/Positive/-- (07/16 0000) Antibody: Negative (07/16 0000) Rubella: Immune (07/16 0000) RPR: Nonreactive (07/16 0000)  HBsAg: Negative (07/16 0000)  HIV: Non-reactive (07/16 0000)  GBS: Negative (02/04 0000)  1 hr glucola normal  Assessment/Plan: 38 yo, G3P2002 at 40.5 wks prior C/s x1 and desires repeat C/s for suspected macrosomia, 4600 gm by office sono on 2/25. Does not desire permanent sterilization.  Risks/complications of surgery reviewed incl infection, bleeding, damage to internal organs including bladder, bowels, ureters, blood vessels, other risks from anesthesia, VTE and delayed complications of any surgery, complications in future surgery reviewed. Also discussed neonatal complications incl difficult delivery, laceration, vacuum assistance, TTN etc. Pt understands and agrees, all concerns addressed.    Katherine Ellison 06/25/2013, 7:57 AM

## 2013-06-26 ENCOUNTER — Encounter (HOSPITAL_COMMUNITY): Payer: Self-pay | Admitting: Obstetrics and Gynecology

## 2013-06-26 LAB — CBC
HEMATOCRIT: 28.2 % — AB (ref 36.0–46.0)
HEMOGLOBIN: 9.7 g/dL — AB (ref 12.0–15.0)
MCH: 31.5 pg (ref 26.0–34.0)
MCHC: 34.4 g/dL (ref 30.0–36.0)
MCV: 91.6 fL (ref 78.0–100.0)
Platelets: 180 10*3/uL (ref 150–400)
RBC: 3.08 MIL/uL — ABNORMAL LOW (ref 3.87–5.11)
RDW: 14.1 % (ref 11.5–15.5)
WBC: 11.7 10*3/uL — ABNORMAL HIGH (ref 4.0–10.5)

## 2013-06-26 LAB — BIRTH TISSUE RECOVERY COLLECTION (PLACENTA DONATION)

## 2013-06-26 NOTE — Lactation Note (Signed)
This note was copied from the chart of Katherine Purvis KiltsMelissa Ramroop. Lactation Consultation Note: Mom resting- reports that baby has been nursing well but it hurts a little at beginning of feeding, then eases off. Left nipple slightly raw on tip of nipple. Encouraged to rub EBM into nipples after nursing. To call for assist prn.  Patient Name: Katherine Ellison WUJWJ'XToday's Date: 06/26/2013 Reason for consult: Follow-up assessment   Maternal Data    Feeding    LATCH Score/Interventions       Type of Nipple: Everted at rest and after stimulation  Comfort (Breast/Nipple): Filling, red/small blisters or bruises, mild/mod discomfort  Problem noted: Mild/Moderate discomfort (at beginning of feeding, then eases off)        Lactation Tools Discussed/Used     Consult Status Consult Status: Follow-up Date: 06/27/13 Follow-up type: In-patient    Pamelia HoitWeeks, Eddye Broxterman D 06/26/2013, 1:20 PM

## 2013-06-26 NOTE — Progress Notes (Signed)
Patient ID: Katherine Ellison, female   DOB: 12/08/1975, 38 y.o.   MRN: [id number] POD # 1  Subjective: Pt reports feeling well, sore/ Pain controlled with ibuprofen and percocet Tolerating po/ Foley d/c'ed and has voided x 1 since foley removed./ No n/v/Flatus neg Activity: out of bed and ambulate Bleeding is light Newborn info:  Information for the patient's newborn:  Lauralee EvenerBunn, Girl Nakima [[id number]][[id number]]  female Feeding: breast   Objective: VS: Blood pressure 114/75, pulse 81, temperature 98.1 F (36.7 C), temperature source Oral, resp. rate 18, height 5\' 6"  (1.676 m), weight 87.544 kg (193 lb), SpO2 97.00%, unknown if currently breastfeeding.    Intake/Output Summary (Last 24 hours) at 06/26/13 0836 Last data filed at 06/26/13 0500  Gross per 24 hour  Intake 4313.75 ml  Output   4715 ml  Net -401.25 ml      Recent Labs  06/25/13 1240 06/26/13 0553  WBC 12.7* 11.7*  HGB 11.8* 9.7*  HCT 34.5* 28.2*  PLT 167 180    Blood type: A POS Rubella: Immune    Physical Exam:  General: alert, cooperative and no distress CV: Regular rate and rhythm Resp: clear Abdomen: soft, nontender, normal bowel sounds Incision: Covered with Tegaderm and honeycomb dressing; well approximated. Uterine Fundus: firm, below umbilicus, nontender Lochia: minimal Ext: edema trace and Homans sign is negative, no sign of DVT    A/P: POD # 1/ G3P3003 S/P C/Section d/t elective repeat Mild ABL anemia Doing well Continue routine post op orders   Signed: Demetrius RevelFISHER,Sherril Heyward K, MSN, [facility] 06/26/2013, 8:36 AM

## 2013-06-27 MED ORDER — IBUPROFEN 600 MG PO TABS: 600.0000 mg | ORAL_TABLET | Freq: Four times a day (QID) | ORAL | Status: DC

## 2013-06-27 MED ORDER — POLYSACCHARIDE IRON COMPLEX 150 MG PO CAPS: 150.0000 mg | ORAL_CAPSULE | Freq: Two times a day (BID) | ORAL | Status: DC

## 2013-06-27 MED ORDER — OXYCODONE-ACETAMINOPHEN 5-325 MG PO TABS: 1.0000 | ORAL_TABLET | ORAL | Status: DC | PRN

## 2013-06-27 MED ORDER — POLYSACCHARIDE IRON COMPLEX 150 MG PO CAPS
150.0000 mg | ORAL_CAPSULE | Freq: Two times a day (BID) | ORAL | Status: DC
  Administered 2013-06-27: 150 mg via ORAL
  Filled 2013-06-27 (×3): qty 1

## 2013-06-27 NOTE — Progress Notes (Signed)
POD # 2  Subjective: Pt reports feeling well, desires early discharge/ Pain controlled with Motrin and Percocet Tolerating po/Voiding without problems/ No n/v/ Flatus present Activity: ad lib Bleeding is light Newborn info:  Information for the patient's newborn:  Lauralee EvenerBunn, Girl Kree [[id number]][[id number]]  female  Feeding: breast   Objective: VS: VS:  Filed Vitals:   06/26/13 0434 06/26/13 0833 06/26/13 2007 06/27/13 0645  BP: 99/62 114/75 116/76 116/74  Pulse: 86 81 94 77  Temp: 98.1 F (36.7 C)  98.6 F (37 C) 97.6 F (36.4 C)  TempSrc: Oral Axillary Axillary Axillary  Resp: 18 18 18 18   Height:      Weight:      SpO2: 98% 97% 96%     I&O: Intake/Output     02/27 0701 - 02/28 0700 02/28 0701 - 03/01 0700   P.O.     I.V. (mL/kg)     Total Intake(mL/kg)     Urine (mL/kg/hr) 700 (0.3)    Blood     Total Output 700     Net -700            LABS:  Recent Labs  06/25/13 1240 06/26/13 0553  WBC 12.7* 11.7*  HGB 11.8* 9.7*  PLT 167 180                           Physical Exam:  General: alert and cooperative CV: Regular rate and rhythm Resp: CTA bilaterally Abdomen: soft, nontender, normal bowel sounds Incision: healing well, no drainage, no erythema, no hernia, no seroma, no swelling, well approximated, honeycomb dsg C/D/I Uterine Fundus: firm, below umbilicus, nontender Lochia: minimal Ext: edema 1+ BLE and Homans sign is negative, no sign of DVT    Assessment: POD # 2/ G3P3003/ S/P C/Section d/t repeat, macrosomia IDA with compounding ABL anemia Doing well and stable for discharge home  Plan: Discharge home RX's: Ibuprofen 600mg  po Q 6 hrs prn pain #30 Refill x 1 Percocet 5/325 1 - 2 tabs po every 4 hrs prn pain #30 Refill x 0 Niferex 150mg  po BID #60 Refill x 1 [facility] booklet given    Signed: Donette LarryBHAMBRI, Brittainy Bucker, Dorris CarnesN, MSN, CNM 06/27/2013, 11:05 AM

## 2013-06-27 NOTE — Lactation Note (Signed)
This note was copied from the chart of Katherine Ellison Plasencia. Lactation Consultation Note Follow up consult:  P3 Mother placed baby in football hold.  LS9.   Mother states her nipples are sore from cluster feeding.  Provided comfort gels and reviewed use and hand expressed milk. Demonstrated to mother how to get a deeper latch to prevent soreness.   Reviewed cluster feeding, engorgement care and lactation support services.   Patient Name: Katherine Ellison Padula UJWJX'BToday's Date: 06/27/2013 Reason for consult: Follow-up assessment   Maternal Data    Feeding Feeding Type: Breast Fed  LATCH Score/Interventions Latch: Grasps breast easily, tongue down, lips flanged, rhythmical sucking.  Audible Swallowing: Spontaneous and intermittent Intervention(s): Alternate breast massage  Type of Nipple: Everted at rest and after stimulation  Comfort (Breast/Nipple): Filling, red/small blisters or bruises, mild/mod discomfort  Problem noted: Mild/Moderate discomfort Interventions (Mild/moderate discomfort): Comfort gels;Hand expression  Hold (Positioning): No assistance needed to correctly position infant at breast.  LATCH Score: 9  Lactation Tools Discussed/Used     Consult Status Consult Status: Complete    Dahlia ByesBerkelhammer, Ruth Boschen 06/27/2013, 10:00 AM

## 2013-06-27 NOTE — Discharge Summary (Signed)
DISCHARGE SUMMARY:  Patient ID: Katherine Ellison MRN: [id number] DOB/AGE: 1975/12/14 37 y.o.  Admit date: 06/25/2013 Admission Diagnoses: 40.[redacted] weeks gestation, Active labor-for repeat CS   Discharge date: 06/27/13    Discharge Diagnoses: S/p repeat cesarean section, IDA with compounding ABL anemia  Prenatal history: [id number]   EDC : 06/20/2013, Alternate EDD Entry  [facility] at [facility]  Primary provider : Dr. Seymour Bars Prenatal course complicated by previous CS, AMA, suspected macrosomia-EFW 4600 grams on 06/24/13  Prenatal Labs: ABO, Rh: A (07/16 0000)  Antibody: NEG (02/26 0745) Rubella: Immune (07/16 0000)  RPR: NON REACTIVE (02/26 0745)  HBsAg: Negative (07/16 0000)  HIV: Non-reactive (07/16 0000)  GBS: Negative (02/04 0000)  GTT: WNL  Medical / Surgical History :  Past medical history:  Past Medical History  Diagnosis Date  . Medical history non-contributory   . Complication of anesthesia   . Postpartum care following cesarean delivery (2/26) 06/26/2013    Past surgical history:  Past Surgical History  Procedure Laterality Date  . Wisdom tooth extraction    . Cesarean section    . Cesarean section N/A 06/25/2013    Procedure: CESAREAN SECTION;  Surgeon: Robley Fries, MD;  Location: [facility];  Service: Obstetrics;  Laterality: N/A;    Family History:  Family History  Problem Relation Age of Onset  . Hyperlipidemia Mother 26    Social History:  reports that she has never smoked. She does not have any smokeless tobacco history on file. She reports that she drinks alcohol. She reports that she does not use illicit drugs.   Allergies: Review of patient's allergies indicates no known allergies.    Current Medications at time of admission:  Prior to Admission medications   Medication Sig Start Date End Date Taking? Authorizing Provider  guaifenesin (ROBITUSSIN) 100 MG/5ML syrup Take 200 mg by mouth 3 (three) times daily as needed for cough  or congestion.   Yes Historical Provider, MD  Prenatal Vit-Fe Fumarate-FA (PRENATAL MULTIVITAMIN) TABS tablet Take 1 tablet by mouth daily at 12 noon.   Yes Historical Provider, MD    Intrapartum Course: prepped for repeat CS   Procedures: Cesarean section delivery of female newborn by Dr Juliene Pina on 06/25/13 See operative report for further details APGAR (1 MIN): 9   APGAR (5 MINS): 9     Postoperative / postpartum course: complicated by IDA with compounding ABL anemia  Physical Exam:   VSS: Temp:  [97.6 F (36.4 C)-98.6 F (37 C)] 97.6 F (36.4 C) (02/28 0645) Pulse Rate:  [77-94] 77 (02/28 0645) Resp:  [18] 18 (02/28 0645) BP: (116)/(74-76) 116/74 mmHg (02/28 0645) SpO2:  [96 %] 96 % (02/27 2007)  LABS:  Recent Labs  06/25/13 1240 06/26/13 0553  WBC 12.7* 11.7*  HGB 11.8* 9.7*  PLT 167 180    General: alert and oriented x3 Heart: RRR Lungs: clear  Abdomen: soft and non-tender / non-distended / active BS  Extremities: 1+ edema BLE / negative Homans  Dressing: C/D/I honeycomb dressing Incision:  approximated with suture / no erythema / no ecchymosis / no drainage  Discharge Instructions:  Discharged Condition: good Activity: pelvic rest and postoperative restrictions x 2 weeks Diet: routine Medications: see below   Medication List    STOP taking these medications       acetaminophen 500 MG tablet  Commonly known as:  TYLENOL     cetirizine 10 MG tablet  Commonly known as:  ZYRTEC  PSYLLIUM PO      TAKE these medications       guaifenesin 100 MG/5ML syrup  Commonly known as:  ROBITUSSIN  Take 200 mg by mouth 3 (three) times daily as needed for cough or congestion.     ibuprofen 600 MG tablet  Commonly known as:  ADVIL,MOTRIN  Take 1 tablet (600 mg total) by mouth every 6 (six) hours.     iron polysaccharides 150 MG capsule  Commonly known as:  NIFEREX  Take 1 capsule (150 mg total) by mouth 2 (two) times daily.     oxyCODONE-acetaminophen  5-325 MG per tablet  Commonly known as:  PERCOCET/ROXICET  Take 1-2 tablets by mouth every 4 (four) hours as needed for severe pain (moderate - severe pain).     prenatal multivitamin Tabs tablet  Take 1 tablet by mouth daily at 12 noon.       Wound Care: Keep dressing clean and dry, remove in 2-3 days Clean incision with warm water and dry thoroughly Call the office for redness, swelling, or drainage from incision  Postpartum Instructions: [facility] discharge booklet - instructions reviewed Discharge to: Home  Follow up : [facility] in 6 weeks for routine postpartum visit                      Signed: Donette LarryBHAMBRI, MELANIE, N MSN, CNM 06/27/2013, 11:28 AM

## 2014-03-01 ENCOUNTER — Encounter (HOSPITAL_COMMUNITY): Payer: Self-pay | Admitting: Obstetrics and Gynecology

## 2018-02-14 ENCOUNTER — Encounter

## 2018-02-14 ENCOUNTER — Ambulatory Visit: Payer: Self-pay | Admitting: Interventional Cardiology

## 2019-11-17 ENCOUNTER — Ambulatory Visit (INDEPENDENT_AMBULATORY_CARE_PROVIDER_SITE_OTHER): Payer: 59 | Admitting: Psychology

## 2019-11-17 DIAGNOSIS — F411 Generalized anxiety disorder: Secondary | ICD-10-CM | POA: Diagnosis not present

## 2019-12-01 ENCOUNTER — Ambulatory Visit (INDEPENDENT_AMBULATORY_CARE_PROVIDER_SITE_OTHER): Payer: 59 | Admitting: Psychology

## 2019-12-01 DIAGNOSIS — F411 Generalized anxiety disorder: Secondary | ICD-10-CM

## 2019-12-15 ENCOUNTER — Ambulatory Visit: Payer: 59 | Admitting: Psychology
